# Patient Record
Sex: Male | Born: 2005 | Race: White | Hispanic: No
Health system: Southern US, Community
[De-identification: ages and names within clinical notes are randomized; demographics above are authoritative.]

---
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
## 2024-03-06 DIAGNOSIS — Z1389 Encounter for screening for other disorder: Secondary | ICD-10-CM | POA: Diagnosis not present

## 2024-03-06 DIAGNOSIS — R946 Abnormal results of thyroid function studies: Secondary | ICD-10-CM | POA: Diagnosis not present

## 2024-03-06 DIAGNOSIS — R82998 Other abnormal findings in urine: Secondary | ICD-10-CM | POA: Diagnosis not present

## 2024-03-06 DIAGNOSIS — Z0000 Encounter for general adult medical examination without abnormal findings: Secondary | ICD-10-CM | POA: Diagnosis not present

## 2024-03-06 DIAGNOSIS — R7989 Other specified abnormal findings of blood chemistry: Secondary | ICD-10-CM | POA: Diagnosis not present

## 2024-05-03 DIAGNOSIS — R946 Abnormal results of thyroid function studies: Secondary | ICD-10-CM | POA: Diagnosis not present
